# Patient Record
Sex: Male | Born: 1985 | Race: Black or African American | Hispanic: No | Marital: Single | Smoking: Current every day smoker
Health system: Southern US, Community
[De-identification: ages and names within clinical notes are randomized; demographics above are authoritative.]

## PROBLEM LIST (undated history)

## (undated) DIAGNOSIS — E119 Type 2 diabetes mellitus without complications: Secondary | ICD-10-CM

---

## 2020-07-03 ENCOUNTER — Emergency Department: Admission: EM | Admit: 2020-07-03 | Discharge: 2020-07-03 | Discharge status: Against Medical Advice | Payer: Self-pay

## 2020-07-04 ENCOUNTER — Other Ambulatory Visit: Payer: Self-pay

## 2020-07-04 ENCOUNTER — Observation Stay: Payer: Self-pay

## 2020-07-04 ENCOUNTER — Observation Stay
Admission: EM | Admit: 2020-07-04 | Discharge: 2020-07-05 | Disposition: A | Discharge status: Home / Self Care | Payer: Self-pay | Attending: Internal Medicine | Admitting: Internal Medicine

## 2020-07-04 ENCOUNTER — Emergency Department: Payer: Self-pay

## 2020-07-04 DIAGNOSIS — E1310 Other specified diabetes mellitus with ketoacidosis without coma: Secondary | ICD-10-CM

## 2020-07-04 DIAGNOSIS — Z794 Long term (current) use of insulin: Secondary | ICD-10-CM | POA: Insufficient documentation

## 2020-07-04 DIAGNOSIS — E1165 Type 2 diabetes mellitus with hyperglycemia: Principal | ICD-10-CM | POA: Diagnosis present

## 2020-07-04 DIAGNOSIS — Z7984 Long term (current) use of oral hypoglycemic drugs: Secondary | ICD-10-CM | POA: Insufficient documentation

## 2020-07-04 DIAGNOSIS — F17210 Nicotine dependence, cigarettes, uncomplicated: Secondary | ICD-10-CM | POA: Insufficient documentation

## 2020-07-04 DIAGNOSIS — R109 Unspecified abdominal pain: Secondary | ICD-10-CM

## 2020-07-04 DIAGNOSIS — E1110 Type 2 diabetes mellitus with ketoacidosis without coma: Secondary | ICD-10-CM | POA: Insufficient documentation

## 2020-07-04 DIAGNOSIS — R112 Nausea with vomiting, unspecified: Secondary | ICD-10-CM

## 2020-07-04 DIAGNOSIS — Z20822 Contact with and (suspected) exposure to covid-19: Secondary | ICD-10-CM | POA: Insufficient documentation

## 2020-07-04 HISTORY — DX: Type 2 diabetes mellitus without complications: E11.9

## 2020-07-04 LAB — URINALYSIS, ROUTINE W REFLEX MICROSCOPIC
Bacteria, UA: NONE SEEN
Bilirubin Urine: NEGATIVE
Glucose, UA: >=500 mg/dL — AB
Hgb urine dipstick: NEGATIVE
Ketones, ur: 80 mg/dL — AB
Leukocytes,Ua: NEGATIVE
Nitrite: NEGATIVE
Protein, ur: 30 mg/dL — AB
Specific Gravity, Urine: 1.028 (ref 1.005–1.030)
pH: 5 (ref 5.0–8.0)

## 2020-07-04 LAB — URINALYSIS, COMPLETE (UACMP) WITH MICROSCOPIC
Bilirubin Urine: NEGATIVE
Glucose, UA: >=500 mg/dL — AB
Hgb urine dipstick: NEGATIVE
Ketones, ur: 80 mg/dL — AB
Leukocytes,Ua: NEGATIVE
Nitrite: NEGATIVE
Protein, ur: 30 mg/dL — AB
Specific Gravity, Urine: 1.033 — ABNORMAL HIGH (ref 1.005–1.030)
pH: 5 (ref 5.0–8.0)

## 2020-07-04 LAB — BASIC METABOLIC PANEL
Anion gap: 14 (ref 5–15)
Anion gap: 13 (ref 5–15)
Anion gap: 11 (ref 5–15)
BUN: 8 mg/dL (ref 6–20)
BUN: 9 mg/dL (ref 6–20)
BUN: 9 mg/dL (ref 6–20)
CO2: 18 mmol/L — ABNORMAL LOW (ref 22–32)
CO2: 20 mmol/L — ABNORMAL LOW (ref 22–32)
CO2: 19 mmol/L — ABNORMAL LOW (ref 22–32)
Calcium: 8.7 mg/dL — ABNORMAL LOW (ref 8.9–10.3)
Calcium: 8.8 mg/dL — ABNORMAL LOW (ref 8.9–10.3)
Calcium: 7.5 mg/dL — ABNORMAL LOW (ref 8.9–10.3)
Chloride: 101 mmol/L (ref 98–111)
Chloride: 99 mmol/L (ref 98–111)
Chloride: 107 mmol/L (ref 98–111)
Creatinine, Ser: 0.7 mg/dL (ref 0.61–1.24)
Creatinine, Ser: 0.72 mg/dL (ref 0.61–1.24)
Creatinine, Ser: 0.51 mg/dL — ABNORMAL LOW (ref 0.61–1.24)
GFR, Estimated: >60 mL/min (ref >60)
GFR, Estimated: >60 mL/min (ref >60)
GFR, Estimated: >60 mL/min (ref >60)
Glucose, Bld: 202 mg/dL — ABNORMAL HIGH (ref 70–99)
Glucose, Bld: 204 mg/dL — ABNORMAL HIGH (ref 70–99)
Glucose, Bld: 93 mg/dL (ref 70–99)
Potassium: 3.3 mmol/L — ABNORMAL LOW (ref 3.5–5.1)
Potassium: 3.2 mmol/L — ABNORMAL LOW (ref 3.5–5.1)
Potassium: 2.8 mmol/L — ABNORMAL LOW (ref 3.5–5.1)
Sodium: 133 mmol/L — ABNORMAL LOW (ref 135–145)
Sodium: 132 mmol/L — ABNORMAL LOW (ref 135–145)
Sodium: 137 mmol/L (ref 135–145)

## 2020-07-04 LAB — CBC
HCT: 49.9 % (ref 39.0–52.0)
Hemoglobin: 16.1 g/dL (ref 13.0–17.0)
MCH: 28.2 pg (ref 26.0–34.0)
MCHC: 32.3 g/dL (ref 30.0–36.0)
MCV: 87.5 fL (ref 80.0–100.0)
Platelets: 270 10*3/uL (ref 150–400)
RBC: 5.7 MIL/uL (ref 4.22–5.81)
RDW: 12.3 % (ref 11.5–15.5)
WBC: 6.3 10*3/uL (ref 4.0–10.5)
nRBC: 0 % (ref 0.0–0.2)

## 2020-07-04 LAB — CBG MONITORING, ED
Glucose-Capillary: 247 mg/dL — ABNORMAL HIGH (ref 70–99)
Glucose-Capillary: 228 mg/dL — ABNORMAL HIGH (ref 70–99)
Glucose-Capillary: 196 mg/dL — ABNORMAL HIGH (ref 70–99)
Glucose-Capillary: 173 mg/dL — ABNORMAL HIGH (ref 70–99)
Glucose-Capillary: 244 mg/dL — ABNORMAL HIGH (ref 70–99)
Glucose-Capillary: 156 mg/dL — ABNORMAL HIGH (ref 70–99)
Glucose-Capillary: 121 mg/dL — ABNORMAL HIGH (ref 70–99)
Glucose-Capillary: 114 mg/dL — ABNORMAL HIGH (ref 70–99)

## 2020-07-04 LAB — HIV ANTIBODY (ROUTINE TESTING W REFLEX): HIV Screen 4th Generation wRfx: NONREACTIVE

## 2020-07-04 LAB — COMPREHENSIVE METABOLIC PANEL
ALT: 14 U/L (ref 0–44)
AST: 11 U/L — ABNORMAL LOW (ref 15–41)
Albumin: 4.1 g/dL (ref 3.5–5.0)
Alkaline Phosphatase: 57 U/L (ref 38–126)
Anion gap: 21 — ABNORMAL HIGH (ref 5–15)
BUN: 10 mg/dL (ref 6–20)
CO2: 17 mmol/L — ABNORMAL LOW (ref 22–32)
Calcium: 9.7 mg/dL (ref 8.9–10.3)
Chloride: 95 mmol/L — ABNORMAL LOW (ref 98–111)
Creatinine, Ser: 0.76 mg/dL (ref 0.61–1.24)
GFR, Estimated: >60 mL/min (ref >60)
Glucose, Bld: 315 mg/dL — ABNORMAL HIGH (ref 70–99)
Potassium: 3.9 mmol/L (ref 3.5–5.1)
Sodium: 133 mmol/L — ABNORMAL LOW (ref 135–145)
Total Bilirubin: 1.4 mg/dL — ABNORMAL HIGH (ref 0.3–1.2)
Total Protein: 8.1 g/dL (ref 6.5–8.1)

## 2020-07-04 LAB — BLOOD GAS, VENOUS
Acid-base deficit: 9.2 mmol/L — ABNORMAL HIGH (ref 0.0–2.0)
Bicarbonate: 15.5 mmol/L — ABNORMAL LOW (ref 20.0–28.0)
O2 Saturation: 88.2 %
Patient temperature: 37
pCO2, Ven: 30 mmHg — ABNORMAL LOW (ref 44.0–60.0)
pH, Ven: 7.32 (ref 7.250–7.430)
pO2, Ven: 60 mmHg — ABNORMAL HIGH (ref 32.0–45.0)

## 2020-07-04 LAB — TROPONIN I (HIGH SENSITIVITY): Troponin I (High Sensitivity): 8 ng/L (ref <18)

## 2020-07-04 LAB — HEMOGLOBIN A1C
Hgb A1c MFr Bld: 14.2 % — ABNORMAL HIGH (ref 4.8–5.6)
Mean Plasma Glucose: 360.84 mg/dL

## 2020-07-04 LAB — LIPASE, BLOOD: Lipase: 34 U/L (ref 11–51)

## 2020-07-04 LAB — BETA-HYDROXYBUTYRIC ACID: Beta-Hydroxybutyric Acid: 7.09 mmol/L — ABNORMAL HIGH (ref 0.05–0.27)

## 2020-07-04 MED ORDER — POLYETHYLENE GLYCOL 3350 17 G PO PACK
17.0000 g | PACK | Freq: Two times a day (BID) | ORAL | Status: DC
  Administered 2020-07-04: 17 g via ORAL
  Filled 2020-07-04: qty 1

## 2020-07-04 MED ORDER — INSULIN ASPART 100 UNIT/ML ~~LOC~~ SOLN
0.0000 [IU] | Freq: Three times a day (TID) | SUBCUTANEOUS | Status: DC
  Administered 2020-07-05: 3 [IU] via SUBCUTANEOUS
  Filled 2020-07-04: qty 1

## 2020-07-04 MED ORDER — POTASSIUM CHLORIDE 10 MEQ/100ML IV SOLN
10.0000 meq | INTRAVENOUS | Status: AC
  Administered 2020-07-04 (×2): 10 meq via INTRAVENOUS
  Filled 2020-07-04: qty 100

## 2020-07-04 MED ORDER — LACTATED RINGERS IV SOLN: INTRAVENOUS | Status: DC

## 2020-07-04 MED ORDER — LACTATED RINGERS IV BOLUS
1000.0000 mL | Freq: Once | INTRAVENOUS | Status: AC
  Administered 2020-07-04: 1000 mL via INTRAVENOUS

## 2020-07-04 MED ORDER — INSULIN REGULAR(HUMAN) IN NACL 100-0.9 UT/100ML-% IV SOLN
INTRAVENOUS | Status: DC
  Administered 2020-07-04: 13 [IU]/h via INTRAVENOUS
  Filled 2020-07-04: qty 100

## 2020-07-04 MED ORDER — POTASSIUM CITRATE-CITRIC ACID 1100-334 MG/5ML PO SOLN
40.0000 meq | Freq: Once | ORAL | Status: DC
  Filled 2020-07-04 (×2): qty 20

## 2020-07-04 MED ORDER — DEXTROSE IN LACTATED RINGERS 5 % IV SOLN: INTRAVENOUS | Status: DC

## 2020-07-04 MED ORDER — DEXTROSE 50 % IV SOLN: 0.0000 mL | INTRAVENOUS | Status: DC | PRN

## 2020-07-04 MED ORDER — INSULIN DETEMIR 100 UNIT/ML ~~LOC~~ SOLN
0.3000 [IU]/kg | Freq: Every day | SUBCUTANEOUS | Status: DC
  Administered 2020-07-04: 30 [IU] via SUBCUTANEOUS
  Filled 2020-07-04: qty 0.3

## 2020-07-04 MED ORDER — ENOXAPARIN SODIUM 40 MG/0.4ML ~~LOC~~ SOLN
40.0000 mg | SUBCUTANEOUS | Status: DC
  Filled 2020-07-04: qty 0.4

## 2020-07-04 MED ORDER — POTASSIUM CHLORIDE 10 MEQ/100ML IV SOLN: 10.0000 meq | INTRAVENOUS | Status: DC

## 2020-07-04 NOTE — H&P (Addendum)
History and Physical   [name] KVQ:[id number] DOB: [birth year] DOA: 07/04/2020  PCP: Pcp, No  Outpatient Specialists: none Patient coming from: home  I have personally briefly reviewed patient's old medical records in [facility] EMR.  Chief Concern: abdominal pain   HPI: Craig Garcia is a 35 y.o. male with medical history significant for DM2 not on insulin or p.o. hypoglycemic agents presented to the emergency department from home for chief concerns of abdominal pain for 2 months.  Patient states that he has not had a bowel movement since 06/01/2020.  He denies nausea and vomiting.  He endorses passing of gas, poor p.o. intake.  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  He has no PCP.  He states that while he was in prison he was on Metformin twice a day and glipizide.  He does not know his dose for either medications.  Patient states that the abdominal pain is dull, right lower quadrant and then diffuse, persistent, for the last 2 months.  He denies radiation.  He states he has never felt this way before.  He also states that he has not had a bowel movement for about a month.  He states that he eats a lot of fried food and did attempt to help himself have a bowel movement but he states that it did not help.  He endorses passing of gas frequently in the last month.  At bedside, abdomen is soft, nondistended, and nontender to deep palpation.  ROS: Constitutional: no weight change, no fever ENT/Mouth: no sore throat, no rhinorrhea Eyes: no eye pain, no vision changes Cardiovascular: no chest pain, no dyspnea,  no edema, no palpitations Respiratory: no cough, no sputum, no wheezing Gastrointestinal: no nausea, no vomiting, no diarrhea, + constipation Genitourinary: no urinary incontinence, no dysuria, no hematuria Musculoskeletal: no arthralgias, no myalgias Skin: no skin lesions, no pruritus, Neuro: + weakness, no  loss of consciousness, no syncope Psych: no anxiety, no depression, + decrease appetite Heme/Lymph: no bruising, no bleeding  ED Course: Gust with ED provider, patient requiring hospitalization for mild DKA in setting of no PCP and not taking any of his home p.o. hypoglycemic agents.  Assessment/Plan  Active Problems:   Hyperglycemia due to diabetes mellitus (HCC)   Mild DKA-secondary to inability to afford medications -Diabetes type 2 -DKA protocol initiated -BMP every 4 hours, beta hydroxybutyrate every 4 hours -Aggressive fluid hydration -Checking A1c -Admit to stepdown observation -Holding home Metformin and glipizide at this time -Patient would benefit from PCP referral on discharge and prescription for Metformin if discharging hospitalist feels this is appropriate -Consult placed to registered dietitian, to discuss diabetic diet with patient  Metabolic acidosis with anion gap-secondary to mild DKA, treat as above  Constipation-low clinical suspicion for ileus and/or obstruction due to no nausea vomiting -MiraLAX p.o. twice daily -We will continue to monitor and if patient develops nausea and vomiting, order CT of the abdomen, we will insert NG tube and consult general surgery  PCP need-consult to transition of care team has been placed by ED provider  As needed medications: Acetaminophen, ondansetron Chart reviewed.   DVT prophylaxis: Enoxaparin Code Status: Full code Diet: Diabetic diet Family Communication: Updated fianc at bedside Disposition Plan: Pending clinical course Consults called: None at this time Admission status: Observation to stepdown  Past Medical History:  Diagnosis Date  . Diabetes mellitus without complication (HCC)    History reviewed. No  pertinent surgical history.  Social History:  reports that he has been smoking cigarettes. He has never used smokeless tobacco. He reports current alcohol use. He reports previous drug use.  No Known  Allergies No family history on file. Family history: Family history reviewed and not pertinent  Prior to Admission medications   Not on File   Physical Exam: Vitals:   07/04/20 0816 07/04/20 0825 07/04/20 1011 07/04/20 1322  BP:  120/85 (!) 129/92 123/78  Pulse:  (!) 125 (!) 138 96  Resp:  19 (!) 24 (!) 22  Temp:  98.8 F (37.1 C) 98.4 F (36.9 C)   TempSrc:  Oral Oral   SpO2:  100% 98% 99%  Weight: 99.8 kg     Height: 6\' 1"  (1.854 m)      Constitutional: appears age-appropriate, NAD, calm, comfortable Eyes: PERRL, lids and conjunctivae normal ENMT: Mucous membranes are moist. Posterior pharynx clear of any exudate or lesions. Age-appropriate dentition. Hearing appropriate Neck: normal, supple, no masses, no thyromegaly Respiratory: clear to auscultation bilaterally, no wheezing, no crackles. Normal respiratory effort. No accessory muscle use.  Cardiovascular: Regular rate and rhythm, no murmurs / rubs / gallops. No extremity edema. 2+ pedal pulses. No carotid bruits.  Abdomen: Flat, soft, no tenderness, no masses palpated, no hepatosplenomegaly. Bowel sounds positive.  Musculoskeletal: no clubbing / cyanosis. No joint deformity upper and lower extremities. Good ROM, no contractures, no atrophy. Normal muscle tone.  Skin: Skin growths in the right lower cheek, since he was a teenager.  Patient denies changes.  Diffuse body tattoos that appear to be chronic and negative for visual evidence of infection. Neurologic: Sensation intact. Strength 5/5 in all 4.  Psychiatric: Normal judgment and insight. Alert and oriented x 3. Normal mood.   EKG: independently reviewed, showing sinus tachycardia, rate of 119, QTc 489  Imaging on Admission: I personally reviewed and I agree with radiologist reading as below.  DG Abd 2 Views  Result Date: 07/04/2020 CLINICAL DATA:  Abdominal pain, constipation for 2-3 months EXAM: ABDOMEN - 2 VIEW COMPARISON:  None. FINDINGS: Nonobstructive pattern of  bowel gas with scattered stool throughout the colon. No free air in the abdomen. IMPRESSION: Nonobstructive pattern of bowel gas with scattered stool throughout the colon. No free air in the abdomen. Electronically Signed   By: 07/06/2020 M.D.   On: 07/04/2020 12:22   Labs on Admission: I have personally reviewed following labs  CBC: Recent Labs  Lab 07/04/20 0826  WBC 6.3  HGB 16.1  HCT 49.9  MCV 87.5  PLT 270   Basic Metabolic Panel: Recent Labs  Lab 07/04/20 0826  NA 133*  K 3.9  CL 95*  CO2 17*  GLUCOSE 315*  BUN 10  CREATININE 0.76  CALCIUM 9.7   GFR: Estimated Creatinine Clearance: 160.2 mL/min (by C-G formula based on SCr of 0.76 mg/dL).  Liver Function Tests: Recent Labs  Lab 07/04/20 0826  AST 11*  ALT 14  ALKPHOS 57  BILITOT 1.4*  PROT 8.1  ALBUMIN 4.1   Recent Labs  Lab 07/04/20 0826  LIPASE 34   Urine analysis:    Component Value Date/Time   COLORURINE YELLOW (A) 07/04/2020 0826   APPEARANCEUR CLEAR (A) 07/04/2020 0826   LABSPEC 1.033 (H) 07/04/2020 0826   PHURINE 5.0 07/04/2020 0826   GLUCOSEU >=500 (A) 07/04/2020 0826   HGBUR NEGATIVE 07/04/2020 0826   BILIRUBINUR NEGATIVE 07/04/2020 0826   KETONESUR 80 (A) 07/04/2020 0826   PROTEINUR 30 (A)  07/04/2020 0826   NITRITE NEGATIVE 07/04/2020 0826   LEUKOCYTESUR NEGATIVE 07/04/2020 0826   Athalia Setterlund N Apollonia Amini D.O. [facility]  If 7PM-7AM, please contact overnight-coverage provider If 7AM-7PM, please contact day coverage provider [email or web address]  07/04/2020, 1:22 PM

## 2020-07-04 NOTE — Progress Notes (Signed)
Inpatient Diabetes Program Recommendations  AACE/ADA: New Consensus Statement on Inpatient Glycemic Control   Target Ranges:  Prepandial:   less than 140 mg/dL      Peak postprandial:   less than 180 mg/dL (1-2 hours)      Critically ill patients:  140 - 180 mg/dL   Results for Craig Garcia, Craig Garcia (MRN [id number]) as of 07/04/2020 12:36  Ref. Range 07/04/2020 08:26  CO2 Latest Ref Range: 22 - 32 mmol/L 17 (L)  Glucose Latest Ref Range: 70 - 99 mg/dL 662 (H)  Anion gap Latest Ref Range: 5 - 15  21 (H)   Review of Glycemic Control  Diabetes history: DM2 Outpatient Diabetes medications: Metformin, Glipizide, NPH and Regular insulin (no DM medications since released from prison in October 2020) Current orders for Inpatient glycemic control: IV insulin  Inpatient Diabetes Program Recommendations:    Insulin: IV insulin should be continued until acidosis completely resolved.  NOTE: Noted patient in ED with DKA and IV insulin ordered. Spoke with patient over the phone regarding DM. Patient states that he was taking Metformin, Glipizide, NPH and Regular insulin in the past. Patient reports that he was in prison and while in prison he was getting Metformin, Glipizide, NPH, Regular insulin. Patient states he was released from prison in October 2020 and that he has not taken any DM medications since he was released from prison. Patient states he has no insurance or PCP. Discussed DKA and basic pathophysiology. Explained how DKA is treated here at the hospital with IV fluids and IV insulin.  Discussed that he would need to consistently take DM medications as an outpatient. Patient states he would be agreeable to take insulin outpatient if prescribed at discharge. Patient reports that he self administered insulin (vial/syringe) while in prison. Informed patient that TOC would be consulted to assist with follow up and medication needs. Discussed [facility]. Patient reports  that he does not have a glucometer of his own.  Will also ask TOC to provide glucometer and testing supplies. Discussed affordable DM supplies at [facility] and also discussed Novolin insulins (NPH, Regular, 70/30) which are affordable insulins at [employer] ($20 per vial or $43 per box of 5 insulin pens).  Informed patient that diabetes coordinator will continue to follow along while inpatient and assist with discharge recommendations if needed.   Thanks, Orlando Penner, RN, MSN, CDE Diabetes Coordinator Inpatient Diabetes Program [phone] (Team Pager from 8am to 5pm)

## 2020-07-04 NOTE — ED Provider Notes (Signed)
[facility] Emergency Department Provider Note    Event Date/Time   First MD Initiated Contact with Patient 07/04/20 1133     (approximate)  I have reviewed the triage vital signs and the nursing notes.   HISTORY  Chief Complaint Abdominal Pain    HPI Craig Garcia is a 35 y.o. male with a history of diabetes not on any medication was previously treated with insulin while he was in prison presents to the ER for several days of nausea crampy abdominal pain burning chest discomfort and generalized malaise. Does not have a PCP. Is worried that his blood sugars have been running high. Does feel generalized malaise and fatigue. Endorses increased urinary frequency.    Past Medical History:  Diagnosis Date  . Diabetes mellitus without complication (HCC)    No family history on file. History reviewed. No pertinent surgical history. There are no problems to display for this patient.     Prior to Admission medications   Not on File    Allergies Patient has no known allergies.    Social History Social History   Tobacco Use  . Smoking status: Current Every Day Smoker    Types: Cigarettes  . Smokeless tobacco: Never Used  Substance Use Topics  . Alcohol use: Yes  . Drug use: Not Currently    Review of Systems Patient denies headaches, rhinorrhea, blurry vision, numbness, shortness of breath, chest pain, edema, cough, abdominal pain, nausea, vomiting, diarrhea, dysuria, fevers, rashes or hallucinations unless otherwise stated above in HPI. ____________________________________________   PHYSICAL EXAM:  VITAL SIGNS: Vitals:   07/04/20 0825 07/04/20 1011  BP: 120/85 (!) 129/92  Pulse: (!) 125 (!) 138  Resp: 19 (!) 24  Temp: 98.8 F (37.1 C) 98.4 F (36.9 C)  SpO2: 100% 98%    Constitutional: Alert and oriented.  Eyes: Conjunctivae are normal.  Head: Atraumatic. Nose: No congestion/rhinnorhea. Mouth/Throat: Mucous membranes are  moist.   Neck: No stridor. Painless ROM.  Cardiovascular: Normal rate, regular rhythm. Grossly normal heart sounds.  Good peripheral circulation. Respiratory: Normal respiratory effort.  No retractions. Lungs CTAB. Gastrointestinal: Soft and nontender. No distention. No abdominal bruits. No CVA tenderness. Genitourinary:  Musculoskeletal: No lower extremity tenderness nor edema.  No joint effusions. Neurologic:  Normal speech and language. No gross focal neurologic deficits are appreciated. No facial droop Skin:  Skin is warm, dry and intact. No rash noted. Psychiatric: Mood and affect are normal. Speech and behavior are normal.  ____________________________________________   LABS (all labs ordered are listed, but only abnormal results are displayed)  Results for orders placed or performed during the hospital encounter of 07/04/20 (from the past 24 hour(s))  Lipase, blood     Status: None   Collection Time: 07/04/20  8:26 AM  Result Value Ref Range   Lipase 34 11 - 51 U/L  Comprehensive metabolic panel     Status: Abnormal   Collection Time: 07/04/20  8:26 AM  Result Value Ref Range   Sodium 133 (L) 135 - 145 mmol/L   Potassium 3.9 3.5 - 5.1 mmol/L   Chloride 95 (L) 98 - 111 mmol/L   CO2 17 (L) 22 - 32 mmol/L   Glucose, Bld 315 (H) 70 - 99 mg/dL   BUN 10 6 - 20 mg/dL   Creatinine, Ser 0.97 0.61 - 1.24 mg/dL   Calcium 9.7 8.9 - 35.3 mg/dL   Total Protein 8.1 6.5 - 8.1 g/dL   Albumin 4.1 3.5 - 5.0 g/dL  AST 11 (L) 15 - 41 U/L   ALT 14 0 - 44 U/L   Alkaline Phosphatase 57 38 - 126 U/L   Total Bilirubin 1.4 (H) 0.3 - 1.2 mg/dL   GFR, Estimated >26 >20 mL/min   Anion gap 21 (H) 5 - 15  CBC     Status: None   Collection Time: 07/04/20  8:26 AM  Result Value Ref Range   WBC 6.3 4.0 - 10.5 K/uL   RBC 5.70 4.22 - 5.81 MIL/uL   Hemoglobin 16.1 13.0 - 17.0 g/dL   HCT 35.5 97.4 - 16.3 %   MCV 87.5 80.0 - 100.0 fL   MCH 28.2 26.0 - 34.0 pg   MCHC 32.3 30.0 - 36.0 g/dL   RDW 84.5  36.4 - 68.0 %   Platelets 270 150 - 400 K/uL   nRBC 0.0 0.0 - 0.2 %  Urinalysis, Complete w Microscopic Urine, Clean Catch     Status: Abnormal   Collection Time: 07/04/20  8:26 AM  Result Value Ref Range   Color, Urine YELLOW (A) YELLOW   APPearance CLEAR (A) CLEAR   Specific Gravity, Urine 1.033 (H) 1.005 - 1.030   pH 5.0 5.0 - 8.0   Glucose, UA >=500 (A) NEGATIVE mg/dL   Hgb urine dipstick NEGATIVE NEGATIVE   Bilirubin Urine NEGATIVE NEGATIVE   Ketones, ur 80 (A) NEGATIVE mg/dL   Protein, ur 30 (A) NEGATIVE mg/dL   Nitrite NEGATIVE NEGATIVE   Leukocytes,Ua NEGATIVE NEGATIVE   RBC / HPF 0-5 0 - 5 RBC/hpf   WBC, UA 0-5 0 - 5 WBC/hpf   Bacteria, UA RARE (A) NONE SEEN   Squamous Epithelial / LPF 0-5 0 - 5   Mucus PRESENT   Troponin I (High Sensitivity)     Status: None   Collection Time: 07/04/20  8:26 AM  Result Value Ref Range   Troponin I (High Sensitivity) 8 <18 ng/L   ____________________________________________  EKG My review and personal interpretation at Time: 8:27   Indication: dka  Rate: 120  Rhythm: sinus Axis: normal Other: normal intervals, no stemi ____________________________________________  RADIOLOGY  I personally reviewed all radiographic images ordered to evaluate for the above acute complaints and reviewed radiology reports and findings.  These findings were personally discussed with the patient.  Please see medical record for radiology report.  ____________________________________________   PROCEDURES  Procedure(s) performed:  .Critical Care Performed by: Willy Eddy, MD Authorized by: Willy Eddy, MD   Critical care provider statement:    Critical care time (minutes):  35   Critical care time was exclusive of:  Separately billable procedures and treating other patients   Critical care was necessary to treat or prevent imminent or life-threatening deterioration of the following conditions:  Endocrine crisis   Critical care was time  spent personally by me on the following activities:  Development of treatment plan with patient or surrogate, discussions with consultants, evaluation of patient's response to treatment, examination of patient, obtaining history from patient or surrogate, ordering and performing treatments and interventions, ordering and review of laboratory studies, ordering and review of radiographic studies, pulse oximetry, re-evaluation of patient's condition and review of old charts      Critical Care performed: yes ____________________________________________   INITIAL IMPRESSION / ASSESSMENT AND PLAN / ED COURSE  Pertinent labs & imaging results that were available during my care of the patient were reviewed by me and considered in my medical decision making (see chart for details).   DDX:  DKA, HHS, dehydration, electrolyte abnormality, gastroparesis, obstruction, medication noncompliance  Craig Garcia is a 35 y.o. who presents to the ED with history of diabetes presents to the ER with the above listed complaints.  Patient tachycardic does appear dehydrated.  Blood work is consistent with DKA.  It is mild.  Patient's presentation complicated by lack of PCP and barriers to getting medication.  Have ordered IV fluids as well as IV insulin infusion.  Will discuss with hospitalist for admission.     The patient was evaluated in Emergency Department today for the symptoms described in the history of present illness. He/she was evaluated in the context of the global COVID-19 pandemic, which necessitated consideration that the patient might be at risk for infection with the SARS-CoV-2 virus that causes COVID-19. Institutional protocols and algorithms that pertain to the evaluation of patients at risk for COVID-19 are in a state of rapid change based on information released by regulatory bodies including the CDC and federal and state organizations. These policies and algorithms were followed during the  patient's care in the ED.  As part of my medical decision making, I reviewed the following data within the electronic MEDICAL RECORD NUMBER Nursing notes reviewed and incorporated, Labs reviewed, notes from prior ED visits and [place] Controlled Substance Database   ____________________________________________   FINAL CLINICAL IMPRESSION(S) / ED DIAGNOSES  Final diagnoses:  Nausea & vomiting  Diabetic ketoacidosis without coma associated with other specified diabetes mellitus (HCC)      NEW MEDICATIONS STARTED DURING THIS VISIT:  New Prescriptions   No medications on file     Note:  This document was prepared using Dragon voice recognition software and may include unintentional dictation errors.    Willy Eddy, MD 07/04/20 1257

## 2020-07-04 NOTE — ED Notes (Signed)
Patient transported to X-ray 

## 2020-07-04 NOTE — ED Triage Notes (Signed)
Pt states he was here yesterday but LWBS, pt c/o abd pain with constipation for the past 2-3 months, pt also c/o chest pain. Pt is in nAD.

## 2020-07-04 NOTE — ED Notes (Signed)
Patient reports he has been feeling unwell for about 2-3 months, patient states he has DM and has been out of medication since October 2021. Patient reports last time he checked his sugar it was in 500s 2 week ago.

## 2020-07-04 NOTE — ED Notes (Signed)
Resumed care from robin rn.  Pt eating cheetos and sitting on stretcher in hallway.  Pt alert  Pt waiting on admission.

## 2020-07-04 NOTE — ED Notes (Signed)
Dr. Cox at bedside.  

## 2020-07-05 ENCOUNTER — Other Ambulatory Visit: Payer: Self-pay | Admitting: Internal Medicine

## 2020-07-05 LAB — BASIC METABOLIC PANEL
Anion gap: 11 (ref 5–15)
Anion gap: 13 (ref 5–15)
BUN: 9 mg/dL (ref 6–20)
BUN: 9 mg/dL (ref 6–20)
CO2: 22 mmol/L (ref 22–32)
CO2: 23 mmol/L (ref 22–32)
Calcium: 8.6 mg/dL — ABNORMAL LOW (ref 8.9–10.3)
Calcium: 9.4 mg/dL (ref 8.9–10.3)
Chloride: 101 mmol/L (ref 98–111)
Chloride: 98 mmol/L (ref 98–111)
Creatinine, Ser: 0.56 mg/dL — ABNORMAL LOW (ref 0.61–1.24)
Creatinine, Ser: 0.68 mg/dL (ref 0.61–1.24)
GFR, Estimated: >60 mL/min (ref >60)
GFR, Estimated: >60 mL/min (ref >60)
Glucose, Bld: 154 mg/dL — ABNORMAL HIGH (ref 70–99)
Glucose, Bld: 188 mg/dL — ABNORMAL HIGH (ref 70–99)
Potassium: 3.2 mmol/L — ABNORMAL LOW (ref 3.5–5.1)
Potassium: 3.5 mmol/L (ref 3.5–5.1)
Sodium: 134 mmol/L — ABNORMAL LOW (ref 135–145)
Sodium: 134 mmol/L — ABNORMAL LOW (ref 135–145)

## 2020-07-05 LAB — BETA-HYDROXYBUTYRIC ACID
Beta-Hydroxybutyric Acid: 1.35 mmol/L — ABNORMAL HIGH (ref 0.05–0.27)
Beta-Hydroxybutyric Acid: 1.93 mmol/L — ABNORMAL HIGH (ref 0.05–0.27)

## 2020-07-05 LAB — CBG MONITORING, ED
Glucose-Capillary: 164 mg/dL — ABNORMAL HIGH (ref 70–99)
Glucose-Capillary: 167 mg/dL — ABNORMAL HIGH (ref 70–99)
Glucose-Capillary: 224 mg/dL — ABNORMAL HIGH (ref 70–99)

## 2020-07-05 LAB — SARS CORONAVIRUS 2 (TAT 6-24 HRS): SARS Coronavirus 2: NEGATIVE

## 2020-07-05 MED ORDER — METFORMIN HCL 1000 MG PO TABS: 1000.0000 mg | ORAL_TABLET | Freq: Two times a day (BID) | ORAL | 1 refills | Status: DC

## 2020-07-05 MED ORDER — INSULIN ASPART PROT & ASPART (70-30 MIX) 100 UNIT/ML ~~LOC~~ SUSP
20.0000 [IU] | Freq: Two times a day (BID) | SUBCUTANEOUS | Status: DC
  Administered 2020-07-05: 20 [IU] via SUBCUTANEOUS
  Filled 2020-07-05: qty 10

## 2020-07-05 MED ORDER — "INSULIN SYRINGE 31G X 5/16"" 0.5 ML MISC": 1.0000 | Freq: Three times a day (TID) | 1 refills | Status: DC

## 2020-07-05 MED ORDER — HUMALOG MIX 75/25 (75-25) 100 UNIT/ML ~~LOC~~ SUSP: 20.0000 [IU] | Freq: Two times a day (BID) | SUBCUTANEOUS | 1 refills | Status: AC

## 2020-07-05 MED ORDER — LIVING WELL WITH DIABETES BOOK
Freq: Once | Status: DC
  Filled 2020-07-05: qty 1

## 2020-07-05 NOTE — Discharge Instructions (Signed)
 Diabetic Ketoacidosis Diabetic ketoacidosis (DKA) is a serious complication of diabetes. This condition develops when there is not enough insulin in the body. Insulin is a hormone that regulates blood sugar (glucose) levels in the body. Normally, insulin allows glucose to enter the cells in the body. The cells break down glucose for energy. Without enough insulin, the body cannot break down glucose and breaks down fats instead. This leads to high blood glucose levels in the body. It also leads to the production of acids that are called ketones. Ketones are poisonous at high levels. If diabetic ketoacidosis is not treated, it can cause severe dehydration and can lead to a coma or death. What are the causes? This condition develops when a lack of insulin causes the body to break down fats instead of glucose. This may be triggered by:  Stress on the body. This stress can be brought on by an illness.  Infection.  Medicines that raise blood glucose levels.  Not taking or skipping doses of diabetes medicines.  New onset of type 1 diabetes mellitus.  Missing insulin on purpose or by accident.  Interruption of insulin through an insulin pump. This can happen if the cannula that connects you to the insulin pump gets dislodged or kinked. What are the signs or symptoms? Symptoms of this condition include:  Excessive thirst or dry mouth.  Excessive urination.  Abdominal pain.  Nausea or vomiting.  Vision changes.  Fruity or sweet-smelling breath.  Weight loss.  Irritability or confusion.  Rapid breathing.  High blood glucose.  High levels of ketones in the body. To know your ketone levels: ? Collect urine in a small cup. ? Dip a test strip in the urine. ? Wait for it to change color. ? Compare the test strip results to the chart on the container. How is this diagnosed? This condition is diagnosed based on your medical history, a physical exam, and blood tests. You may also  have a urine test to check for ketones. How is this treated? This condition may be treated with:  Fluid replacement. This may be done with IV fluids to correct dehydration.  Correcting high blood glucose with insulin. This may be given through the skin as injections or through an IV.  Electrolyte replacement. Electrolytes are minerals in your blood. Electrolytes such as potassium and sodium may be given in pill form or through an IV.  Antibiotic medicines. These may be prescribed if your condition was caused by an infection. Diabetic ketoacidosis is a serious medical condition. You may need emergency treatment in the hospital so that you can be monitored closely. Follow these instructions at home: Medicines  Take over-the-counter and prescription medicines only as told by your health care provider.  Continue to take insulin and other diabetes medicines as told by your health care provider.  If you were prescribed an antibiotic medicine, take it as told by your health care provider. Do not stop taking the antibiotic even if you start to feel better. Eating and drinking  Drink enough fluids to keep your urine pale yellow.  If you are able to eat, follow your usual diet and drink sugar-free liquids such as water, tea, and sugar-free soft drinks. You can also have sugar-free gelatin or ice pops.  If you are not able to eat, drink liquids that contain sugar in small amounts as you are able. Liquids include fruit juice, regular soft drinks, and sherbet.   Checking ketones and blood glucose  Check your urine for   when you are ill and as told by your health care provider. ? If your blood glucose is 240 mg/dL (38.7 mmol/L) or higher, check your urine ketones every 4 hours. If you have moderate or large ketones, call your health care provider.  Check your blood glucose every day, and as often as told by your health care provider. ? If your blood glucose is high, drink plenty of fluids. This  helps to flush out ketones. ? If your blood glucose is above your target for 2 tests in a row, contact your health care provider.   General instructions  Carry a medical alert card or wear medical alert jewelry that shows that you have diabetes.  Exercise only as told by your health care provider. Do not exercise when your blood glucose is high and you have ketones in your urine.  If you get sick, call your health care provider and begin treatment quickly. Your body often needs extra insulin to fight an illness. Check your blood glucose every 4 hours when you are sick.  Keep all follow-up visits as told by your health care provider. This is important. Where to find more information  American Diabetes Association: diabetes.org Contact a health care provider if:  Your blood glucose level is higher than 240 mg/dL (56.4 mmol/L) for 2 days in a row.  You have moderate or large ketones in your urine.  You have a fever.  You cannot eat or drink without vomiting.  You have been vomiting for more than 2 hours.  You continue to have symptoms of diabetic ketoacidosis.  You develop new symptoms. Get help right away if:  Your blood glucose monitor reads high even when you are taking insulin.  You faint.  You have chest pain.  You have trouble breathing.  You have sudden trouble speaking or swallowing.  You have vomiting or diarrhea that gets worse after 3 hours.  You are unable to stay awake.  You have trouble thinking.  You are severely dehydrated. Symptoms of severe dehydration include: ? Extreme thirst. ? Dry mouth. ? Rapid breathing. These symptoms may represent a serious problem that is an emergency. Do not wait to see if the symptoms will go away. Get medical help right away. Call your local emergency services (911 in the [place] Do not drive yourself to the hospital. Summary  Diabetic ketoacidosis is a serious complication of diabetes. This condition develops when there  is not enough insulin in the body.  This condition is diagnosed based on your medical history, a physical exam, and blood tests. You may also have a urine test to check for ketones.  Diabetic ketoacidosis is a serious medical condition. You may need emergency treatment in the hospital to monitor your condition.  Contact your health care provider if your blood glucose is higher than 240 mg/dL for 2 days in a row or if you have moderate or large ketones in your urine. This information is not intended to replace advice given to you by your health care provider. Make sure you discuss any questions you have with your health care provider. Document Revised: 04/21/2019 Document Reviewed: 04/21/2019 Elsevier Patient Education  2021 [employer]  Diabetes Mellitus Basics  Diabetes mellitus, or diabetes, is a long-term (chronic) disease. It occurs when the body does not properly use sugar (glucose) that is released from food after you eat. Diabetes mellitus may be caused by one or both of these problems:  Your pancreas does not make enough of a hormone  called insulin.  Your body does not react in a normal way to the insulin that it makes. Insulin lets glucose enter cells in your body. This gives you energy. If you have diabetes, glucose cannot get into cells. This causes high blood glucose (hyperglycemia). How to treat and manage diabetes You may need to take insulin or other diabetes medicines daily to keep your glucose in balance. If you are prescribed insulin, you will learn how to give yourself insulin by injection. You may need to adjust the amount of insulin you take based on the foods that you eat. You will need to check your blood glucose levels using a glucose monitor as told by your health care provider. The readings can help determine if you have low or high blood glucose. Generally, you should have these blood glucose levels:  Before meals (preprandial): 80-130 mg/dL (4.0-9.8  mmol/L).  After meals (postprandial): below 180 mg/dL (10 mmol/L).  Hemoglobin A1c (HbA1c) level: less than 7%. Your health care provider will set treatment goals for you. Keep all follow-up visits. This is important. Follow these instructions at home: Diabetes medicines Take your diabetes medicines every day as told by your health care provider. List your diabetes medicines here:  Name of medicine: ______________________________ ? Amount (dose): _______________ Time (a.m./p.m.): _______________ Notes: ___________________________________  Name of medicine: ______________________________ ? Amount (dose): _______________ Time (a.m./p.m.): _______________ Notes: ___________________________________  Name of medicine: ______________________________ ? Amount (dose): _______________ Time (a.m./p.m.): _______________ Notes: ___________________________________ Insulin If you use insulin, list the types of insulin you use here:  Insulin type: ______________________________ ? Amount (dose): _______________ Time (a.m./p.m.): _______________Notes: ___________________________________  Insulin type: ______________________________ ? Amount (dose): _______________ Time (a.m./p.m.): _______________ Notes: ___________________________________  Insulin type: ______________________________ ? Amount (dose): _______________ Time (a.m./p.m.): _______________ Notes: ___________________________________  Insulin type: ______________________________ ? Amount (dose): _______________ Time (a.m./p.m.): _______________ Notes: ___________________________________  Insulin type: ______________________________ ? Amount (dose): _______________ Time (a.m./p.m.): _______________ Notes: ___________________________________ Managing blood glucose Check your blood glucose levels using a glucose monitor as told by your health care provider. Write down the times that you check your glucose levels here:  Time:  _______________ Notes: ___________________________________  Time: _______________ Notes: ___________________________________  Time: _______________ Notes: ___________________________________  Time: _______________ Notes: ___________________________________  Time: _______________ Notes: ___________________________________  Time: _______________ Notes: ___________________________________   Low blood glucose Low blood glucose (hypoglycemia) is when glucose is at or below 70 mg/dL (3.9 mmol/L). Symptoms may include:  Feeling: ? Hungry. ? Sweaty and clammy. ? Irritable or easily upset. ? Dizzy. ? Sleepy.  Having: ? A fast heartbeat. ? A headache. ? A change in your vision. ? Numbness around the mouth, lips, or tongue.  Having trouble with: ? Moving (coordination). ? Sleeping. Treating low blood glucose To treat low blood glucose, eat or drink something containing sugar right away. If you can think clearly and swallow safely, follow the 15:15 rule:  Take 15 grams of a fast-acting carb (carbohydrate), as told by your health care provider.  Some fast-acting carbs are: ? Glucose tablets: take 3-4 tablets. ? Hard candy: eat 3-5 pieces. ? Fruit juice: drink 4 oz (120 mL). ? Regular (not diet) soda: drink 4-6 oz (120-180 mL). ? Honey or sugar: eat 1 Tbsp (15 mL).  Check your blood glucose levels 15 minutes after you take the carb.  If your glucose is still at or below 70 mg/dL (3.9 mmol/L), take 15 grams of a carb again.  If your glucose does not go above 70 mg/dL (3.9 mmol/L) after 3 tries, get  help right away.  After your glucose goes back to normal, eat a meal or a snack within 1 hour. Treating very low blood glucose If your glucose is at or below 54 mg/dL (3 mmol/L), you have very low blood glucose (severe hypoglycemia). This is an emergency. Do not wait to see if the symptoms will go away. Get medical help right away. Call your local emergency services (911 in the [place]  Do not drive yourself to the hospital. Questions to ask your health care provider  Should I talk with a diabetes educator?  What equipment will I need to care for myself at home?  What diabetes medicines do I need? When should I take them?  How often do I need to check my blood glucose levels?  What number can I call if I have questions?  When is my follow-up visit?  Where can I find a support group for people with diabetes? Where to find more information  American Diabetes Association: www.diabetes.org  Association of Diabetes Care and Education Specialists: www.diabeteseducator.org Contact a health care provider if:  Your blood glucose is at or above 240 mg/dL (67.8 mmol/L) for 2 days in a row.  You have been sick or have had a fever for 2 days or more, and you are not getting better.  You have any of these problems for more than 6 hours: ? You cannot eat or drink. ? You feel nauseous. ? You vomit. ? You have diarrhea. Get help right away if:  Your blood glucose is lower than 54 mg/dL (3 mmol/L).  You get confused.  You have trouble thinking clearly.  You have trouble breathing. These symptoms may represent a serious problem that is an emergency. Do not wait to see if the symptoms will go away. Get medical help right away. Call your local emergency services (911 in the [place] Do not drive yourself to the hospital. Summary  Diabetes mellitus is a chronic disease that occurs when the body does not properly use sugar (glucose) that is released from food after you eat.  Take insulin and diabetes medicines as told.  Check your blood glucose every day, as often as told.  Keep all follow-up visits. This is important. This information is not intended to replace advice given to you by your health care provider. Make sure you discuss any questions you have with your health care provider. Document Revised: 09/28/2019 Document Reviewed: 09/28/2019 Elsevier Patient Education   2021 [employer].

## 2020-07-05 NOTE — Progress Notes (Signed)
Night coverage  Patient admitted for DKA wanted to leave the hospital, stating he was feeling better and was asking for insulin pens which she said was promised to him earlier.  After a long talk lasting at least 15 minutes about the risks of leaving given that he has no primary care provider and still has some acidosis on his BMP though blood sugars have improved.  For now he has decided to stay.

## 2020-07-05 NOTE — ED Notes (Signed)
Pt wants to leave.  Dr Para March aware.  Pt on cell phone.  Family with pt.  Pt in hallway bed.  Pt alert   Speech clear  Iv in place

## 2020-07-05 NOTE — ED Notes (Signed)
Pt sleeping. 

## 2020-07-05 NOTE — Discharge Summary (Signed)
Physician Discharge Summary  Craig Garcia QZE:[id number] DOB: 10/15/1985 DOA: 07/04/2020  PCP: Pcp, No  Admit date: 07/04/2020 Discharge date: 07/05/2020  Admitted From: Home Disposition: Home  Recommendations for Outpatient Follow-up:  1. Follow up with PCP in 1-2 weeks 2. Pick up medications from med management clinic  Home Health: No Equipment/Devices: None Discharge Condition: Stable CODE STATUS: Full Diet recommendation: Carb modified  Brief/Interim Summary: 35 y.o. male with medical history significant for DM2 not on insulin or p.o. hypoglycemic agents presented to the emergency department from home for chief concerns of abdominal pain for 2 months.  Patient states that he has not had a bowel movement since 06/01/2020.  He denies nausea and vomiting.  He endorses passing of gas, poor p.o. intake.  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  He has no PCP.  He states that while he was in prison he was on Metformin twice a day and glipizide.  He does not know his dose for either medications.  Patient states that the abdominal pain is dull, right lower quadrant and then diffuse, persistent, for the last 2 months.  He denies radiation.  He states he has never felt this way before.  He also states that he has not had a bowel movement for about a month.  He states that he eats a lot of fried food and did attempt to help himself have a bowel movement but he states that it did not help.  Patient was started on Endo tool protocol for treatment of severe hyperglycemia and DKA.  Sugars improved and anion gap closed.  Patient was initially transitioned to Levemir regimen however after discussion with diabetes coordinator as patient is uninsured with no PCP will transition to insulin 70/30 20 units twice daily.  Remained stable on this regimen with good glycemic control.  We had a lengthy conversation about his hemoglobin A1c of 14.6 and  the need for good glycemic control.  He expressed understanding.  As patient is tolerating p.o. intake with normalization of blood sugars will discharge home at this time.  Medications sent to med management clinic.  Medications include insulin 75/25 20 units twice daily, insulin syringes, Metformin 1000 mg twice daily.  Requested that [facility] evaluate patient and provide information for follow-up primary care clinic.  Discharge Diagnoses:  Active Problems:   Hyperglycemia due to diabetes mellitus (HCC)  Diabetic ketoacidosis Secondary to medication nonadherence Hemoglobin A1c 14.6 Patient has no PCP and is uninsured Anion gap closed Transition to oral and subcutaneous regimen on discharge Discharge on insulin 75/25 20 units twice daily, metformin 1000 mg twice daily TOC consult for PCP assistance  Discharge Instructions  Discharge Instructions    Diet - low sodium heart healthy   Complete by: As directed    Increase activity slowly   Complete by: As directed      Allergies as of 07/05/2020   No Known Allergies     Medication List    TAKE these medications   HumaLOG Mix 75/25 (75-25) 100 UNIT/ML Susp injection Generic drug: insulin lispro protamine-lispro Inject 20 Units into the skin 2 (two) times daily with a meal.   INSULIN SYRINGE .5CC/31GX5/16" 31G X 5/16" 0.5 ML Misc 1 each by Does not apply route 4 (four) times daily -  before meals and at bedtime.   metFORMIN 1000 MG tablet Commonly known as: Glucophage Take 1 tablet (1,000 mg total) by mouth 2 (two)  times daily with a meal.       No Known Allergies  Consultations:  None   Procedures/Studies: Portable chest x-ray (1 view)  Result Date: 07/04/2020 CLINICAL DATA:  Abdominal pain and constipation. Chest pain. Diabetes. EXAM: PORTABLE CHEST 1 VIEW COMPARISON:  None. FINDINGS: The heart size and mediastinal contours are within normal limits. Both lungs are clear. The visualized skeletal structures are  unremarkable. IMPRESSION: No active disease. Electronically Signed   By: Paulina Fusi M.D.   On: 07/04/2020 15:06   DG Abd 2 Views  Result Date: 07/04/2020 CLINICAL DATA:  Abdominal pain, constipation for 2-3 months EXAM: ABDOMEN - 2 VIEW COMPARISON:  None. FINDINGS: Nonobstructive pattern of bowel gas with scattered stool throughout the colon. No free air in the abdomen. IMPRESSION: Nonobstructive pattern of bowel gas with scattered stool throughout the colon. No free air in the abdomen. Electronically Signed   By: Lauralyn Primes M.D.   On: 07/04/2020 12:22    (Echo, Carotid, EGD, Colonoscopy, ERCP)    Subjective: Patient seen and examined on the day of discharge.  Stable, no distress.  Tolerating p.o.  Stable for discharge home  Discharge Exam: Vitals:   07/05/20 0745 07/05/20 1225  BP: 124/81 132/82  Pulse: 72 90  Resp: 18 18  Temp: 98.5 F (36.9 C)   SpO2: 99% 99%   Vitals:   07/04/20 2210 07/05/20 0232 07/05/20 0745 07/05/20 1225  BP: 116/76 121/73 124/81 132/82  Pulse: (!) 101 71 72 90  Resp: 20 20 18 18   Temp:  98.4 F (36.9 C) 98.5 F (36.9 C)   TempSrc:  Oral Oral   SpO2: 98% 99% 99% 99%  Weight:      Height:        General: Pt is alert, awake, not in acute distress Cardiovascular: RRR, S1/S2 +, no rubs, no gallops Respiratory: CTA bilaterally, no wheezing, no rhonchi Abdominal: Soft, NT, ND, bowel sounds + Extremities: no edema, no cyanosis    The results of significant diagnostics from this hospitalization (including imaging, microbiology, ancillary and laboratory) are listed below for reference.     Microbiology: Recent Results (from the past 240 hour(s))  SARS CORONAVIRUS 2 (TAT 6-24 HRS) Nasopharyngeal Nasopharyngeal Swab     Status: None   Collection Time: 07/04/20  3:33 PM   Specimen: Nasopharyngeal Swab  Result Value Ref Range Status   SARS Coronavirus 2 NEGATIVE NEGATIVE Final    Comment: (NOTE) SARS-CoV-2 target nucleic acids are NOT  DETECTED.  The SARS-CoV-2 RNA is generally detectable in upper and lower respiratory specimens during the acute phase of infection. Negative results do not preclude SARS-CoV-2 infection, do not rule out co-infections with other pathogens, and should not be used as the sole basis for treatment or other patient management decisions. Negative results must be combined with clinical observations, patient history, and epidemiological information. The expected result is Negative.  Fact Sheet for Patients: 07/06/20  Fact Sheet for Healthcare Providers: HairSlick.no  This test is not yet approved or cleared by the [place]odir[place]ir.com FDA and  has been authorized for detection and/or diagnosis of SARS-CoV-2 by FDA under an Emergency Use Authorization (EUA). This EUA will remain  in effect (meaning this test can be used) for the duration of the COVID-19 declaration under Se ction 564(b)(1) of the Act, 21 U.S.C. section 360bbb-3(b)(1), unless the authorization is terminated or revoked sooner.  Performed at [facility], [address], [place], [place]  Labs: BNP (last 3 results) No results for input(s): BNP in the last 8760 hours. Basic Metabolic Panel: Recent Labs  Lab 07/04/20 1446 07/04/20 1803 07/04/20 2239 07/05/20 0341 07/05/20 0833  NA 133* 132* 137 134* 134*  K 3.3* 3.2* 2.8* 3.2* 3.5  CL 101 99 107 101 98  CO2 18* 20* 19* 22 23  GLUCOSE 202* 204* 93 154* 188*  BUN 8 9 9 9 9   CREATININE 0.70 0.72 0.51* 0.56* 0.68  CALCIUM 8.7* 8.8* 7.5* 8.6* 9.4   Liver Function Tests: Recent Labs  Lab 07/04/20 0826  AST 11*  ALT 14  ALKPHOS 57  BILITOT 1.4*  PROT 8.1  ALBUMIN 4.1   Recent Labs  Lab 07/04/20 0826  LIPASE 34   No results for input(s): AMMONIA in the last 168 hours. CBC: Recent Labs  Lab 07/04/20 0826  WBC 6.3  HGB 16.1  HCT 49.9  MCV 87.5  PLT 270   Cardiac Enzymes: No  results for input(s): CKTOTAL, CKMB, CKMBINDEX, TROPONINI in the last 168 hours. BNP: Invalid input(s): POCBNP CBG: Recent Labs  Lab 07/04/20 2042 07/04/20 2235 07/05/20 0200 07/05/20 0825 07/05/20 1215  GLUCAP 121* 114* 164* 167* 224*   D-Dimer No results for input(s): DDIMER in the last 72 hours. Hgb A1c Recent Labs    07/04/20 1446  HGBA1C 14.2*   Lipid Profile No results for input(s): CHOL, HDL, LDLCALC, TRIG, CHOLHDL, LDLDIRECT in the last 72 hours. Thyroid function studies No results for input(s): TSH, T4TOTAL, T3FREE, THYROIDAB in the last 72 hours.  Invalid input(s): FREET3 Anemia work up No results for input(s): VITAMINB12, FOLATE, FERRITIN, TIBC, IRON, RETICCTPCT in the last 72 hours. Urinalysis    Component Value Date/Time   COLORURINE YELLOW (A) 07/04/2020 1220   APPEARANCEUR CLEAR (A) 07/04/2020 1220   LABSPEC 1.028 07/04/2020 1220   PHURINE 5.0 07/04/2020 1220   GLUCOSEU >=500 (A) 07/04/2020 1220   HGBUR NEGATIVE 07/04/2020 1220   BILIRUBINUR NEGATIVE 07/04/2020 1220   KETONESUR 80 (A) 07/04/2020 1220   PROTEINUR 30 (A) 07/04/2020 1220   NITRITE NEGATIVE 07/04/2020 1220   LEUKOCYTESUR NEGATIVE 07/04/2020 1220   Sepsis Labs Invalid input(s): PROCALCITONIN,  WBC,  LACTICIDVEN Microbiology Recent Results (from the past 240 hour(s))  SARS CORONAVIRUS 2 (TAT 6-24 HRS) Nasopharyngeal Nasopharyngeal Swab     Status: None   Collection Time: 07/04/20  3:33 PM   Specimen: Nasopharyngeal Swab  Result Value Ref Range Status   SARS Coronavirus 2 NEGATIVE NEGATIVE Final    Comment: (NOTE) SARS-CoV-2 target nucleic acids are NOT DETECTED.  The SARS-CoV-2 RNA is generally detectable in upper and lower respiratory specimens during the acute phase of infection. Negative results do not preclude SARS-CoV-2 infection, do not rule out co-infections with other pathogens, and should not be used as the sole basis for treatment or other patient management  decisions. Negative results must be combined with clinical observations, patient history, and epidemiological information. The expected result is Negative.  Fact Sheet for Patients: 07/06/20  Fact Sheet for Healthcare Providers: HairSlick.no  This test is not yet approved or cleared by the [place].[employer] FDA and  has been authorized for detection and/or diagnosis of SARS-CoV-2 by FDA under an Emergency Use Authorization (EUA). This EUA will remain  in effect (meaning this test can be used) for the duration of the COVID-19 declaration under Se ction 564(b)(1) of the Act, 21 U.S.C. section 360bbb-3(b)(1), unless the authorization is terminated or revoked sooner.  Performed at [facility], [address], [place], [place] [address]      Time coordinating discharge: Over 30 minutes  SIGNED:   Tresa Moore, MD  [facility] 07/05/2020, 2:28 PM Pager   If 7PM-7AM, please contact night-coverage

## 2020-07-05 NOTE — ED Notes (Signed)
Report off to austin rn 

## 2020-07-05 NOTE — ED Notes (Signed)
Message from pharmacy stating that insulin has now been loaded into pxsys. Medication was not available hence delay in administration of insulin this AM.

## 2020-07-05 NOTE — Progress Notes (Signed)
Inpatient Diabetes Program Recommendations  AACE/ADA: New Consensus Statement on Inpatient Glycemic Control   Target Ranges:  Prepandial:   less than 140 mg/dL      Peak postprandial:   less than 180 mg/dL (1-2 hours)      Critically ill patients:  140 - 180 mg/dL   Results for JARQUIS, WALKER (MRN [id number]) as of 07/05/2020 07:10  Ref. Range 07/04/2020 12:56 07/04/2020 13:59 07/04/2020 15:20 07/04/2020 16:16 07/04/2020 18:01 07/04/2020 19:08 07/04/2020 20:42 07/04/2020 22:35 07/05/2020 02:00  Glucose-Capillary Latest Ref Range: 70 - 99 mg/dL 993 (H) 716 (H) 967 (H) 173 (H) 244 (H) 156 (H) 121 (H) 114 (H) 164 (H)  Results for GAVYNN, DUVALL (MRN [id number]) as of 07/05/2020 07:10  Ref. Range 07/04/2020 14:46  Hemoglobin A1C Latest Ref Range: 4.8 - 5.6 % 14.2 (H)   Review of Glycemic Control  Diabetes history: DM2 Outpatient Diabetes medications: Metformin, Glipizide, NPH and Regular insulin (no DM medications since released from prison in October 2020) Current orders for Inpatient glycemic control: Levemir 30 units QHS, Novolog 0-9 units TID with meals  Inpatient Diabetes Program Recommendations:    Insulin: Please consider discontinuing Levemir and ordering 70/30 20 units BID (dose would provide a total of 28 units for basal and 12 units for meal coverage per day) to start with supper today.   HbgA1C: A1C 14.2% on 07/04/20 indicating an average glucose of 361 mg/dl over the past 2-3 months. Would recommend discharging patient on Metformin 1000 mg BID and Humalog 75/25 20 units BID.   NOTE: Patient is uninsured and has no PCP. Will need TOC to assist with follow up, medications, and provide glucometer and testing supplies. Given patient's A1C of 14.2% and presenting in DKA, would recommend discharging patient on Metformin 1000 mg BID and Humalog 75/25 20 units BID. If patient will be getting insulin from [facility] please provide Rx for: Humalog 75/25 vials (#[id number]), insulin  syringes (#[id number]), and Metformin.  Spoke with patient again over the phone regarding DM management and A1C. Patient states that he has not talked with anyone from [facility] yet. Discussed A1C of 14.2% on 07/04/20 and explained that A1C indicates an average glucose of 361 mg/dl over the past 2-3 months. Informed patient that given he presented in DKA and A1C is so high, it would be recommended that he be discharged on insulin and Metformin. Patient is agreeable to take insulin outpatient and has taken NPH and Regular insulin in the past. Patient reports he self injected insulin while in prison and is knowledgeable about insulin administration. Discussed that given he will likely get medications from [facility], they have Humalog 75/25 insulin so it would be recommended that he be discharged on Humalog 75/25.  Discussed glucose and A1C goals. Discussed importance of checking CBGs and maintaining good CBG control to prevent long-term and short-term complications. Explained how hyperglycemia leads to damage within blood vessels which lead to the common complications seen with uncontrolled diabetes. Stressed to the patient the importance of improving glycemic control to prevent further complications from uncontrolled diabetes. Encouraged patient to be sure to make appointment with [facility] and to follow up consistently. Asked patient to be sure to fill out application for [facility] and [facility] once it is received from [facility] so he can turn it in when he picks up medication.  Patient verbalized understanding of information discussed and reports no further questions at this time related to diabetes. Sent communication to [place], Charity fundraiser to  ask that she see who is covering Emergency Department from [facility] team to ensure they see patient early this morning.   Thanks, Orlando Penner, RN, MSN, CDE Diabetes Coordinator Inpatient Diabetes Program [phone] (Team Pager from 8am to 5pm)

## 2020-07-05 NOTE — ED Notes (Signed)
Pt sleeping in hallway bed.

## 2020-07-05 NOTE — ED Notes (Signed)
fsbs 164

## 2020-07-05 NOTE — ED Notes (Addendum)
Pt was walking out with IVs. Ian Malkin, RN aware. This tech was able to get the pt back and informed the pt he cannot walk out the hospital with IV. Pt stated that, "I was allow to go outside," and "You followed all the way for this." Pt was very agitated. Pt stated, "I have been in the hall for more than then 12 hrs. And there is a covid pt infront me."  This tech informed the pt that he can tell his nurse but he is not allow to leave the building with IVs again. Pt told this tech, "Dont talk to me."

## 2020-07-13 ENCOUNTER — Telehealth: Payer: Self-pay | Admitting: Pharmacy Technician

## 2020-07-13 NOTE — Telephone Encounter (Signed)
Patient received a 30 day supply of medication.  Provided patient with new patient packet to obtain ongoing [facility] services.  [facility] must receive requested financial documentation within 30 days in order to determine eligibility and provide additional medication assistance.  Cadynce Garrette J. Sephora Boyar Care Manager [facility] 

## 2020-10-12 ENCOUNTER — Telehealth: Payer: Self-pay | Admitting: Pharmacist

## 2020-10-12 NOTE — Telephone Encounter (Signed)
Patient failed to provide requested 2022 financial documentation. Unable to determine patient's eligibility status for [facility]. No additional medication assistance will be provided by [facility] without the required proof of income documentation. Patient notified by letter.  Vonda Henderson [facility] Administrative Assistant 

## 2021-09-13 IMAGING — CR DG CHEST 1V PORT
1 series · 1 of 1 positions shown · non-contrast
Comparison: None.

CLINICAL DATA: Abdominal pain and constipation. Chest pain.
Diabetes.

EXAM:
PORTABLE CHEST 1 VIEW

[w chest pa]
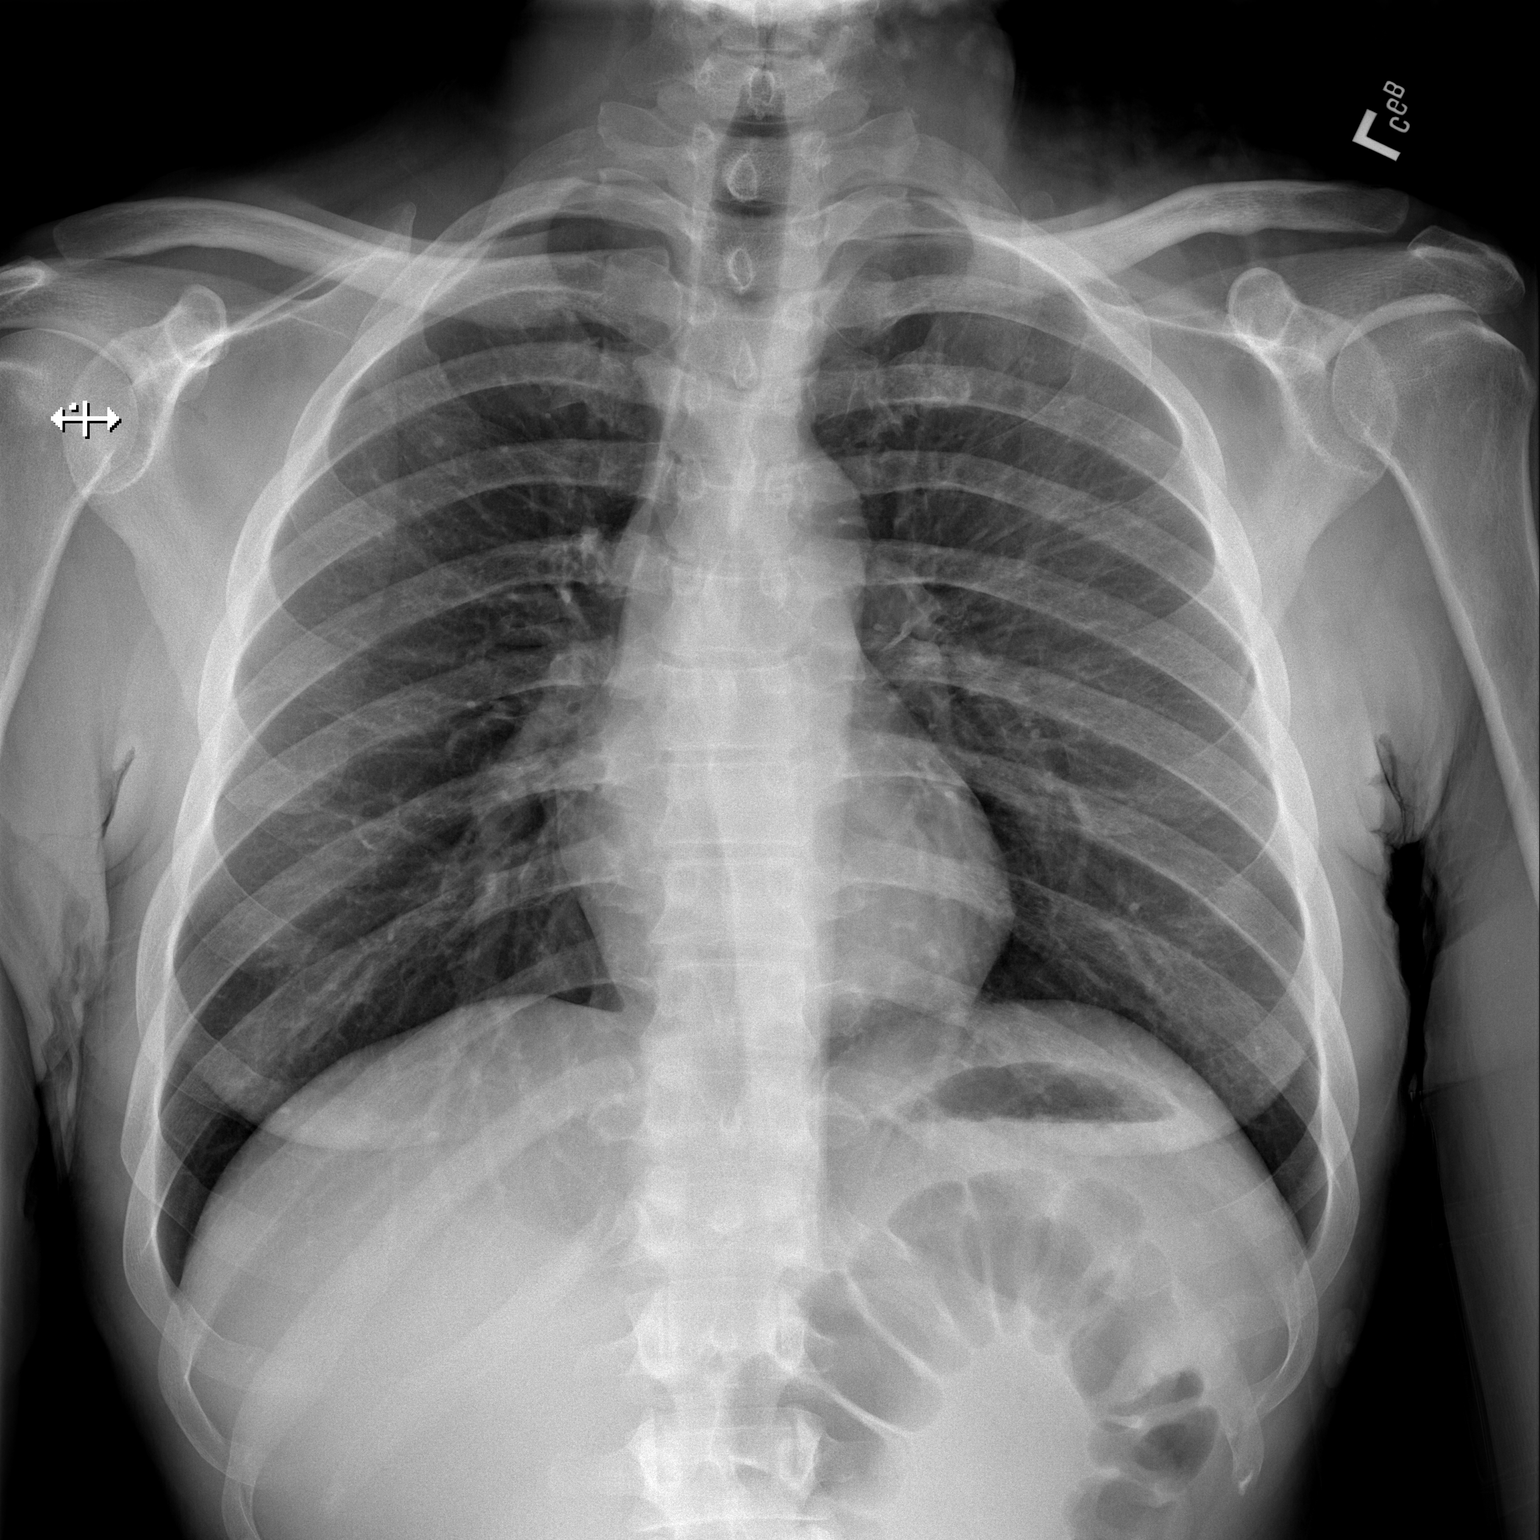

[1 of 1 positions shown; findings below may reference images not displayed]

FINDINGS: The heart size and mediastinal contours are within normal limits.
Both lungs are clear. The visualized skeletal structures are
unremarkable.
IMPRESSION: No active disease.

## 2021-09-13 IMAGING — CR DG ABDOMEN 2V
3 series · 3 of 3 positions shown · non-contrast
Comparison: None.

CLINICAL DATA: Abdominal pain, constipation for 2-3 months

EXAM:
ABDOMEN - 2 VIEW

[abdomen erect]
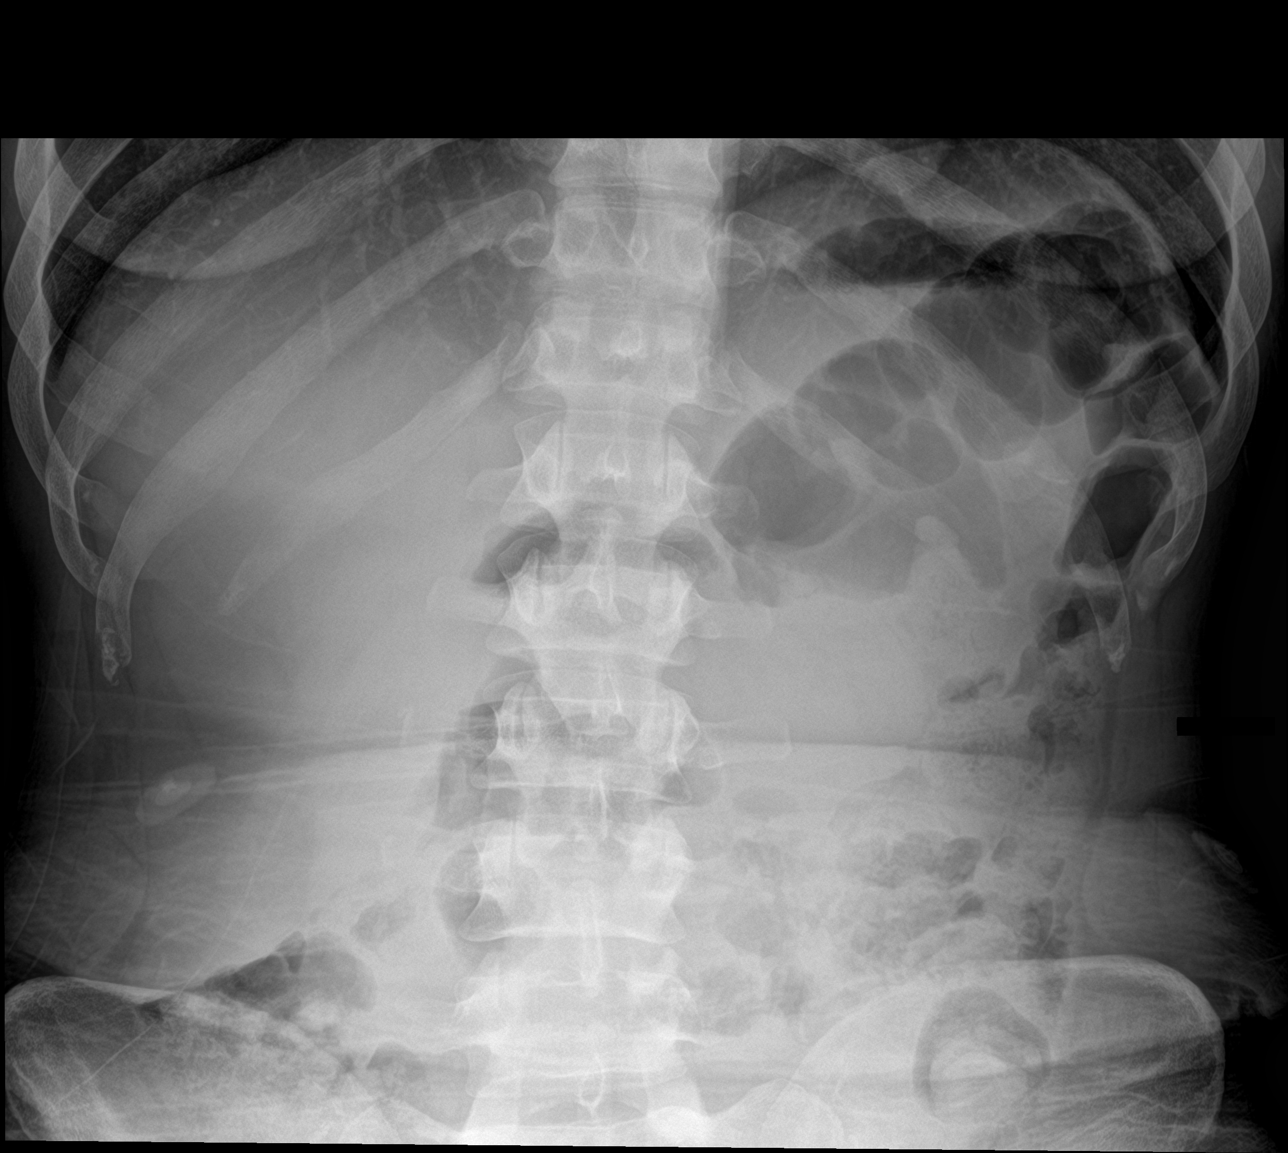

[abdomen supine (1 of 2)]
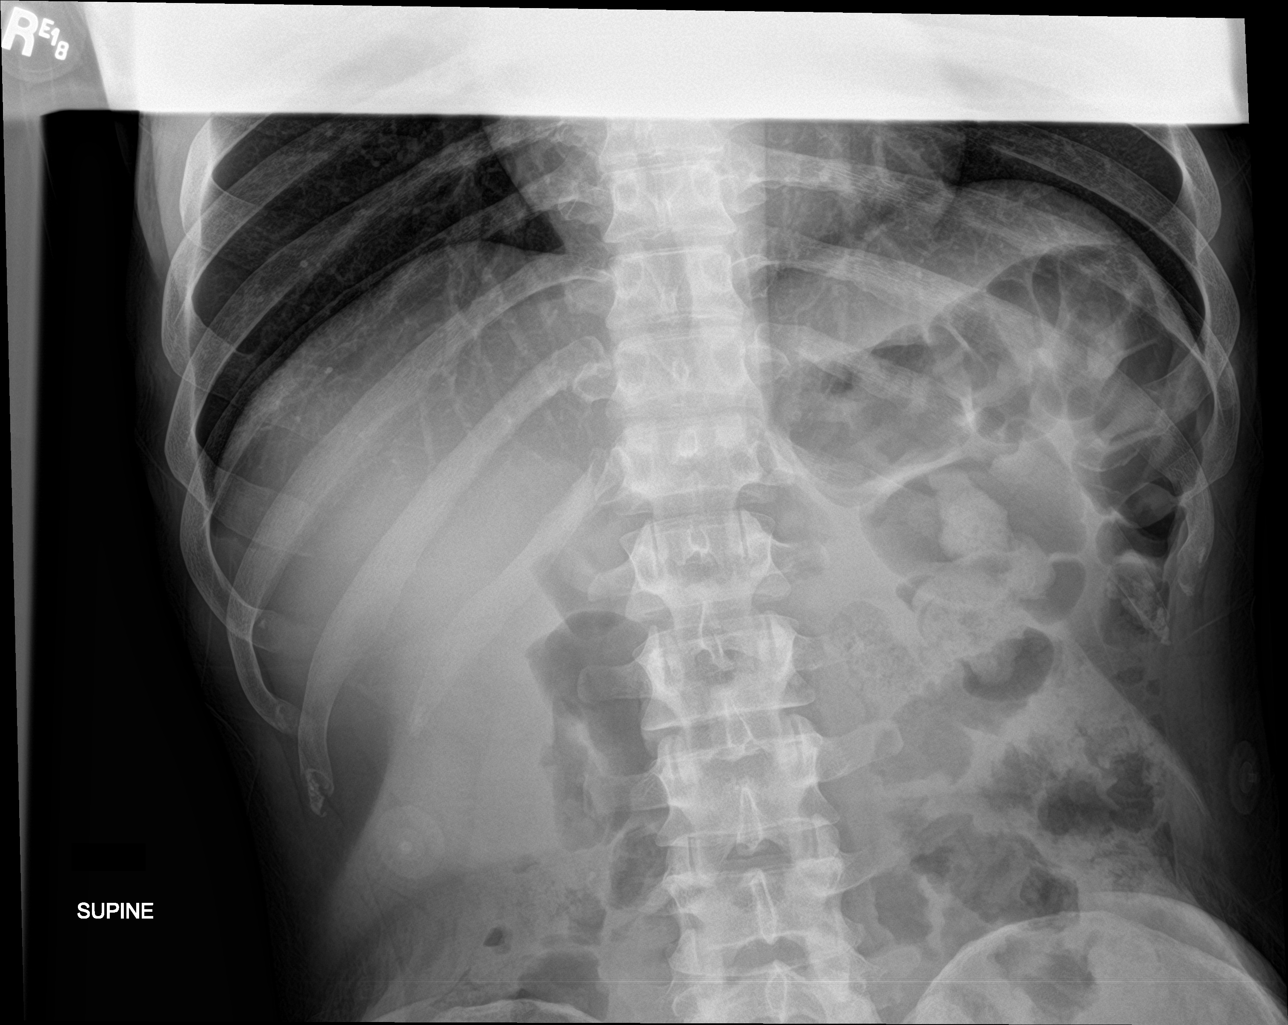

[abdomen supine (2 of 2)]
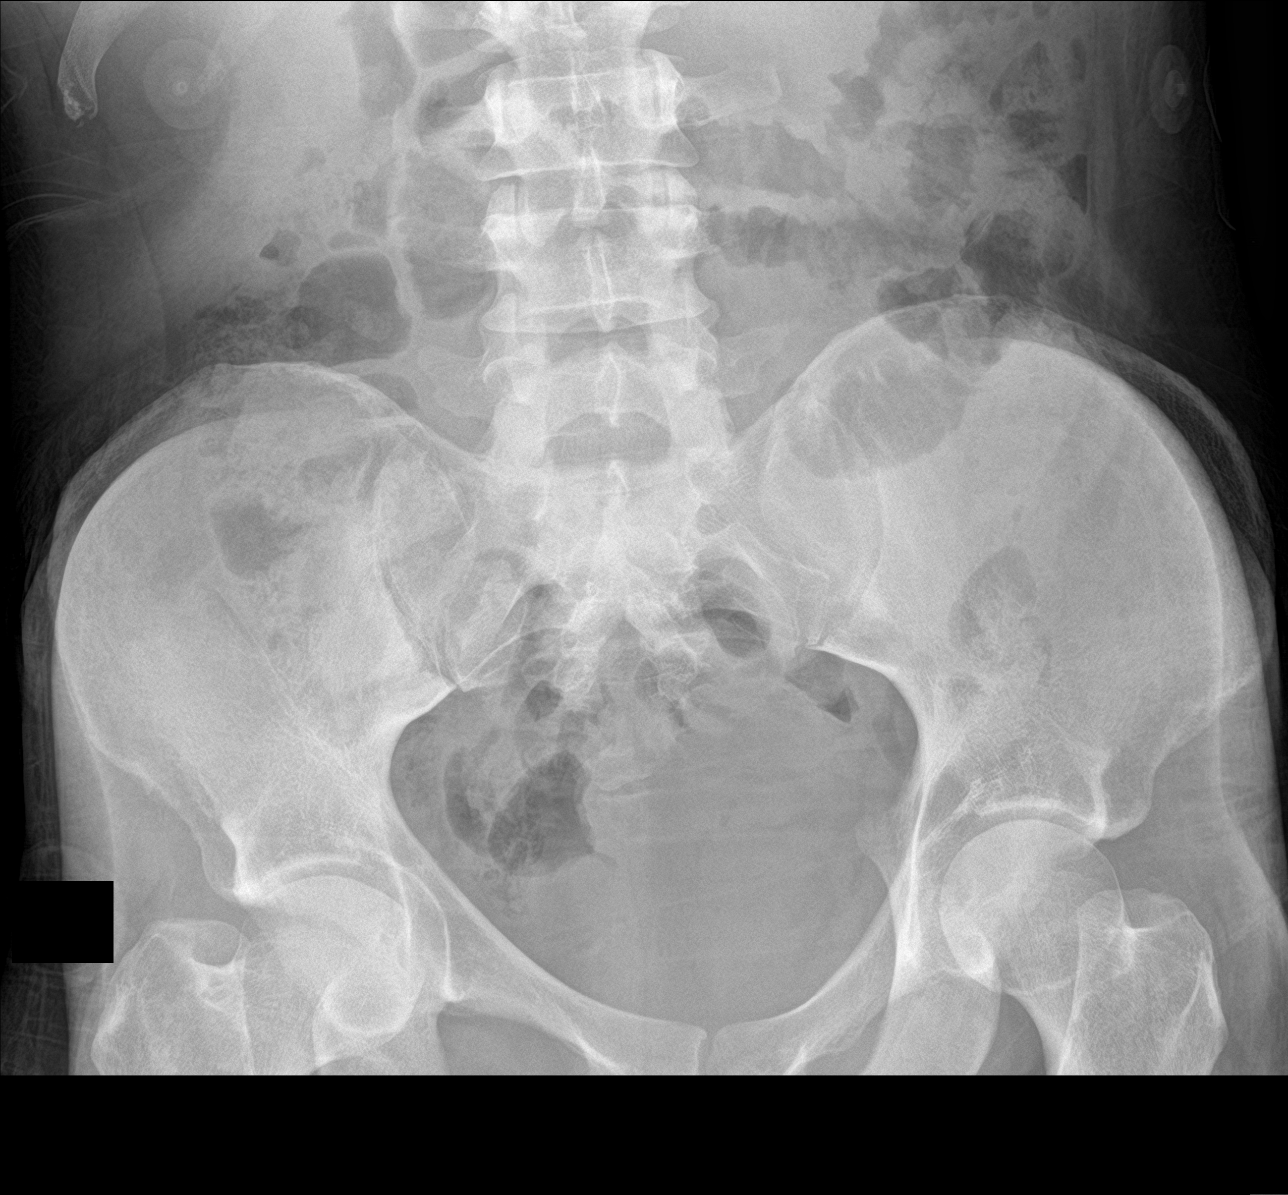

[3 of 3 positions shown; findings below may reference images not displayed]

FINDINGS: Nonobstructive pattern of bowel gas with scattered stool throughout
the colon. No free air in the abdomen.
IMPRESSION: Nonobstructive pattern of bowel gas with scattered stool throughout
the colon. No free air in the abdomen.

## 2022-12-25 ENCOUNTER — Telehealth (INDEPENDENT_AMBULATORY_CARE_PROVIDER_SITE_OTHER): Payer: Self-pay

## 2022-12-25 NOTE — Telephone Encounter (Signed)
Copied from CRM [id number]. Topic: General - Other >> Dec 25, 2022  3:47 PM Epimenio Foot F wrote: Reason for CRM: Pt's wife August is calling in because she sees Marcelino Duster and she would like to set pt an appointment to establish care. Please follow up with August. [phone]

## 2023-02-20 ENCOUNTER — Ambulatory Visit (INDEPENDENT_AMBULATORY_CARE_PROVIDER_SITE_OTHER): Payer: Self-pay | Admitting: Primary Care

## 2023-02-27 ENCOUNTER — Ambulatory Visit (INDEPENDENT_AMBULATORY_CARE_PROVIDER_SITE_OTHER): Payer: MEDICAID | Admitting: Primary Care

## 2023-02-27 ENCOUNTER — Other Ambulatory Visit: Payer: Self-pay

## 2023-02-27 ENCOUNTER — Encounter (INDEPENDENT_AMBULATORY_CARE_PROVIDER_SITE_OTHER): Payer: Self-pay | Admitting: Primary Care

## 2023-02-27 VITALS — BP 144/83 | HR 92 | Resp 16 | Ht 74.0 in | Wt 253.8 lb

## 2023-02-27 DIAGNOSIS — I10 Essential (primary) hypertension: Secondary | ICD-10-CM | POA: Diagnosis not present

## 2023-02-27 DIAGNOSIS — E1165 Type 2 diabetes mellitus with hyperglycemia: Secondary | ICD-10-CM | POA: Diagnosis not present

## 2023-02-27 DIAGNOSIS — E119 Type 2 diabetes mellitus without complications: Secondary | ICD-10-CM

## 2023-02-27 DIAGNOSIS — Z1159 Encounter for screening for other viral diseases: Secondary | ICD-10-CM

## 2023-02-27 DIAGNOSIS — Z7984 Long term (current) use of oral hypoglycemic drugs: Secondary | ICD-10-CM | POA: Diagnosis not present

## 2023-02-27 LAB — POCT GLYCOSYLATED HEMOGLOBIN (HGB A1C)

## 2023-02-27 LAB — GLUCOSE, POCT (MANUAL RESULT ENTRY): POC Glucose: 400 mg/dl — AB (ref 70–99)

## 2023-02-27 MED ORDER — METFORMIN HCL ER 500 MG PO TB24
500.0000 mg | ORAL_TABLET | Freq: Two times a day (BID) | ORAL | 1 refills | Status: DC
  Filled 2023-02-27: qty 180, 90d supply, fill #0

## 2023-02-27 MED ORDER — LANTUS SOLOSTAR 100 UNIT/ML ~~LOC~~ SOPN
20.0000 [IU] | PEN_INJECTOR | Freq: Every day | SUBCUTANEOUS | 3 refills | Status: AC
  Filled 2023-02-27 (×2): qty 3, 15d supply, fill #0
  Filled 2023-02-28: qty 15, 75d supply, fill #0

## 2023-02-27 MED ORDER — LOSARTAN POTASSIUM 25 MG PO TABS
25.0000 mg | ORAL_TABLET | Freq: Every day | ORAL | 1 refills | Status: AC
Start: 2023-02-27 — End: ?
  Filled 2023-02-27: qty 90, 90d supply, fill #0

## 2023-02-27 NOTE — Patient Instructions (Signed)
Complications from uncontrolled diabetes -diabetic retinopathy leading to blindness, diabetic nephropathy leading to dialysis, decrease in circulation decrease in sores or wound healing which may lead to amputations and increase of heart attack and stroke

## 2023-02-27 NOTE — Progress Notes (Signed)
[facility]   Subjective:   Craig Garcia is a 37 y.o. male presents for  establish care. T2D Denies polyuria, polydipsia, or vision changes.  Does check blood sugars at home. 200-400. Today to high to read Past Medical History:  Diagnosis Date   Diabetes mellitus without complication (HCC)      No Known Allergies    Current Outpatient Medications on File Prior to Visit  Medication Sig Dispense Refill   Insulin Lispro Prot & Lispro (HUMALOG 75/25 MIX) (75-25) 100 UNIT/ML Kwikpen INJECT 20 UNITS INTO THE SKIN 2 TIMES A DAY WITH A MEAL 15 mL 1   insulin lispro protamine-lispro (HUMALOG MIX 75/25) (75-25) 100 UNIT/ML SUSP injection Inject 20 Units into the skin 2 (two) times daily with a meal. 12 mL 1   Insulin Syringe-Needle U-100 (INSULIN SYRINGE .5CC/31GX5/16") 31G X 5/16" 0.5 ML MISC 1 each by Does not apply route 4 (four) times daily -  before meals and at bedtime. (Patient not taking: Reported on 02/27/2023) 100 each 1   metFORMIN (GLUCOPHAGE) 1000 MG tablet TAKE ONE TABLET BY MOUTH 2 TIMES A DAY WITH A MEAL 60 tablet 1   No current facility-administered medications on file prior to visit.     Review of System: ROS  Objective:  BP (!) 144/83   Pulse 92   Resp 16   Ht 6\' 2"  (1.88 m)   Wt 253 lb 12.8 oz (115.1 kg)   SpO2 99%   BMI 32.59 kg/m   Filed Weights   02/27/23 1523  Weight: 253 lb 12.8 oz (115.1 kg)    Physical Exam:   General Appearance: Well nourished, in no apparent distress. Eyes: PERRLA, EOMs, conjunctiva no swelling or erythema Sinuses: No Frontal/maxillary tenderness ENT/Mouth: Ext aud canals clear, TMs without erythema, bulging. No erythema, swelling, or exudate on post pharynx.  Tonsils not swollen or erythematous. Hearing normal.  Neck: Supple, thyroid normal.  Respiratory: Respiratory effort normal, BS equal bilaterally without rales, rhonchi, wheezing or stridor.  Cardio: RRR with no MRGs. Brisk peripheral pulses without  edema.  Abdomen: Soft, + BS.  Non tender, no guarding, rebound, hernias, masses. Lymphatics: Non tender without lymphadenopathy.  Musculoskeletal: Full ROM, 5/5 strength, normal gait.  Skin: Warm, dry without rashes, lesions, ecchymosis.  Neuro: Cranial nerves intact. Normal muscle tone, no cerebellar symptoms. Sensation intact.  Psych: Awake and oriented X 3, normal affect, Insight and Judgment appropriate.    Assessment:  Craig Garcia was seen today for hyperglycemia.  Diagnoses and all orders for this visit:  Hyperglycemia due to diabetes mellitus (HCC) - educated on lifestyle modifications, including but not limited to diet choices and adding exercise to daily routine.   -     POCT glucose (manual entry) -     POCT glycosylated hemoglobin (Hb A1C) -     Microalbumin / creatinine urine ratio -     CBC with Differential/Platelet -     Lipid panel  Comprehensive diabetic foot examination, type 2 DM, encounter for [facility]) Completed   Essential hypertension BP goal - < 130/80 Explained that having normal blood pressure is the goal and medications are helping to get to goal and maintain normal blood pressure. DIET: Limit salt intake, read nutrition labels to check salt content, limit fried and high fatty foods  Avoid using multisymptom OTC cold preparations that generally contain sudafed which can rise BP. Consult with pharmacist on best cold relief products to use for persons with HTN EXERCISE Discussed incorporating exercise such  as walking - 30 minutes most days of the week and can do in 10 minute intervals    -     CMP14+EGFR -     losartan (COZAAR) 25 MG tablet; Take 1 tablet (25 mg total) by mouth daily.  Encounter for HCV screening test for low risk patient -     HCV Ab w Reflex to Quant PCR  Other orders -     metFORMIN (GLUCOPHAGE-XR) 500 MG 24 hr tablet; Take 1 tablet (500 mg total) by mouth 2 (two) times daily with a meal. -     Insulin Glargine w/ Trans Port (BASAGLAR TEMPO  PEN) 100 UNIT/ML SOPN; Inject 20 Units into the skin daily.    This note has been created with Education officer, environmental. Any transcriptional errors are unintentional.   Grayce Sessions, NP 02/27/2023, 4:54 PM

## 2023-02-28 ENCOUNTER — Other Ambulatory Visit: Payer: Self-pay

## 2023-02-28 LAB — LIPID PANEL
Chol/HDL Ratio: 2.9 ratio (ref 0.0–5.0)
Cholesterol, Total: 157 mg/dL (ref 100–199)
HDL: 54 mg/dL (ref >39)
LDL Chol Calc (NIH): 79 mg/dL (ref 0–99)
Triglycerides: 140 mg/dL (ref 0–149)
VLDL Cholesterol Cal: 24 mg/dL (ref 5–40)

## 2023-02-28 LAB — CMP14+EGFR
ALT: 15 IU/L (ref 0–44)
AST: 11 IU/L (ref 0–40)
Albumin: 4.3 g/dL (ref 4.1–5.1)
Alkaline Phosphatase: 92 IU/L (ref 44–121)
BUN/Creatinine Ratio: 10 (ref 9–20)
BUN: 11 mg/dL (ref 6–20)
Bilirubin Total: 0.3 mg/dL (ref 0.0–1.2)
CO2: 25 mmol/L (ref 20–29)
Calcium: 9.7 mg/dL (ref 8.7–10.2)
Chloride: 95 mmol/L — ABNORMAL LOW (ref 96–106)
Creatinine, Ser: 1.05 mg/dL (ref 0.76–1.27)
Globulin, Total: 3.3 g/dL (ref 1.5–4.5)
Glucose: 389 mg/dL — ABNORMAL HIGH (ref 70–99)
Potassium: 3.8 mmol/L (ref 3.5–5.2)
Sodium: 137 mmol/L (ref 134–144)
Total Protein: 7.6 g/dL (ref 6.0–8.5)
eGFR: 94 mL/min/{1.73_m2} (ref >59)

## 2023-02-28 LAB — MICROALBUMIN / CREATININE URINE RATIO
Creatinine, Urine: 73.2 mg/dL
Microalb/Creat Ratio: 10 mg/g creat (ref 0–29)
Microalbumin, Urine: 7.4 ug/mL

## 2023-02-28 LAB — CBC WITH DIFFERENTIAL/PLATELET
Basophils Absolute: 0 10*3/uL (ref 0.0–0.2)
Basos: 1 %
EOS (ABSOLUTE): 0.1 10*3/uL (ref 0.0–0.4)
Eos: 2 %
Hematocrit: 48.9 % (ref 37.5–51.0)
Hemoglobin: 15.6 g/dL (ref 13.0–17.7)
Immature Grans (Abs): 0 10*3/uL (ref 0.0–0.1)
Immature Granulocytes: 0 %
Lymphocytes Absolute: 3.9 10*3/uL — ABNORMAL HIGH (ref 0.7–3.1)
Lymphs: 46 %
MCH: 28.5 pg (ref 26.6–33.0)
MCHC: 31.9 g/dL (ref 31.5–35.7)
MCV: 89 fL (ref 79–97)
Monocytes Absolute: 0.6 10*3/uL (ref 0.1–0.9)
Monocytes: 8 %
Neutrophils Absolute: 3.6 10*3/uL (ref 1.4–7.0)
Neutrophils: 43 %
Platelets: 265 10*3/uL (ref 150–450)
RBC: 5.48 x10E6/uL (ref 4.14–5.80)
RDW: 12.3 % (ref 11.6–15.4)
WBC: 8.3 10*3/uL (ref 3.4–10.8)

## 2023-02-28 LAB — HCV AB W REFLEX TO QUANT PCR: HCV Ab: NONREACTIVE

## 2023-02-28 LAB — HCV INTERPRETATION

## 2023-03-27 ENCOUNTER — Ambulatory Visit: Payer: MEDICAID | Admitting: Pharmacist

## 2023-05-29 ENCOUNTER — Ambulatory Visit (INDEPENDENT_AMBULATORY_CARE_PROVIDER_SITE_OTHER): Payer: MEDICAID | Admitting: Primary Care

## 2023-08-11 ENCOUNTER — Other Ambulatory Visit (INDEPENDENT_AMBULATORY_CARE_PROVIDER_SITE_OTHER): Payer: Self-pay | Admitting: Primary Care

## 2023-12-17 ENCOUNTER — Ambulatory Visit (INDEPENDENT_AMBULATORY_CARE_PROVIDER_SITE_OTHER): Payer: MEDICAID | Admitting: Primary Care

## 2023-12-31 ENCOUNTER — Telehealth (INDEPENDENT_AMBULATORY_CARE_PROVIDER_SITE_OTHER): Payer: Self-pay | Admitting: Primary Care

## 2023-12-31 NOTE — Telephone Encounter (Signed)
 Called pt to confirm appt but pt was unavailable.

## 2024-01-01 ENCOUNTER — Ambulatory Visit (INDEPENDENT_AMBULATORY_CARE_PROVIDER_SITE_OTHER): Payer: Self-pay | Admitting: Primary Care
